# Patient Record
Sex: Female | Born: 1990 | Race: White | Hispanic: No | Smoking: Current every day smoker
Health system: Southern US, Community
[De-identification: ages and names within clinical notes are randomized; demographics above are authoritative.]

## PROBLEM LIST (undated history)

## (undated) DIAGNOSIS — S069X9A Unspecified intracranial injury with loss of consciousness of unspecified duration, initial encounter: Secondary | ICD-10-CM

## (undated) DIAGNOSIS — S069XAA Unspecified intracranial injury with loss of consciousness status unknown, initial encounter: Secondary | ICD-10-CM

## (undated) HISTORY — PX: SPLENECTOMY: SUR1306

## (undated) HISTORY — PX: BRAIN SURGERY: SHX531

---
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
## 2008-01-08 ENCOUNTER — Encounter: Admission: RE | Admit: 2008-01-08 | Discharge: 2008-01-08 | Payer: Self-pay | Admitting: Internal Medicine

## 2008-01-30 ENCOUNTER — Emergency Department (HOSPITAL_COMMUNITY): Admission: EM | Admit: 2008-01-30 | Discharge: 2008-01-30 | Payer: Self-pay | Admitting: Emergency Medicine

## 2008-01-31 ENCOUNTER — Ambulatory Visit: Payer: Self-pay | Admitting: Internal Medicine

## 2008-05-20 ENCOUNTER — Ambulatory Visit: Payer: Self-pay | Admitting: Internal Medicine

## 2008-12-29 ENCOUNTER — Ambulatory Visit: Payer: Self-pay | Admitting: Internal Medicine

## 2009-03-03 IMAGING — CR DG SINUSES COMPLETE 3+V
3 series · 3 of 3 positions shown · non-contrast
Comparison: No priors

CLINICAL DATA: Sinus pain.

PARANASAL SINUSES - 1-2 VIEW

[view not recorded (1 of 3)]
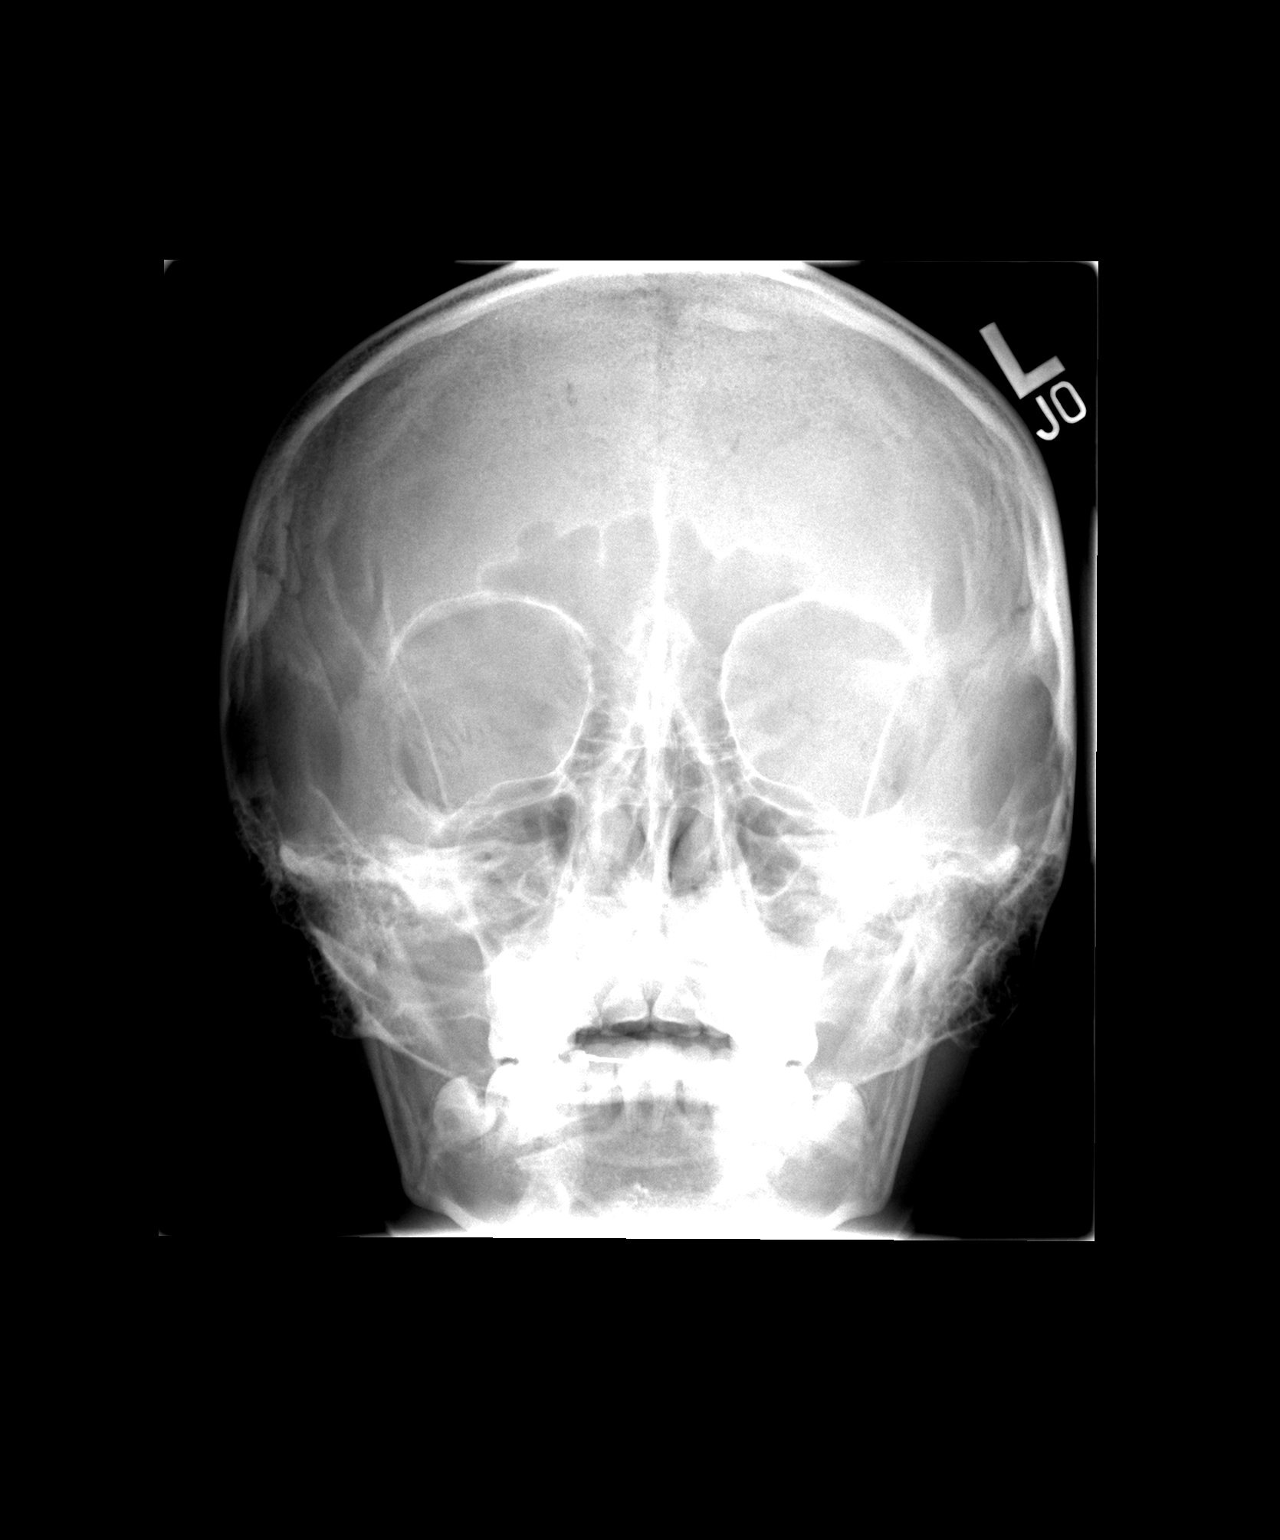

[view not recorded (2 of 3)]
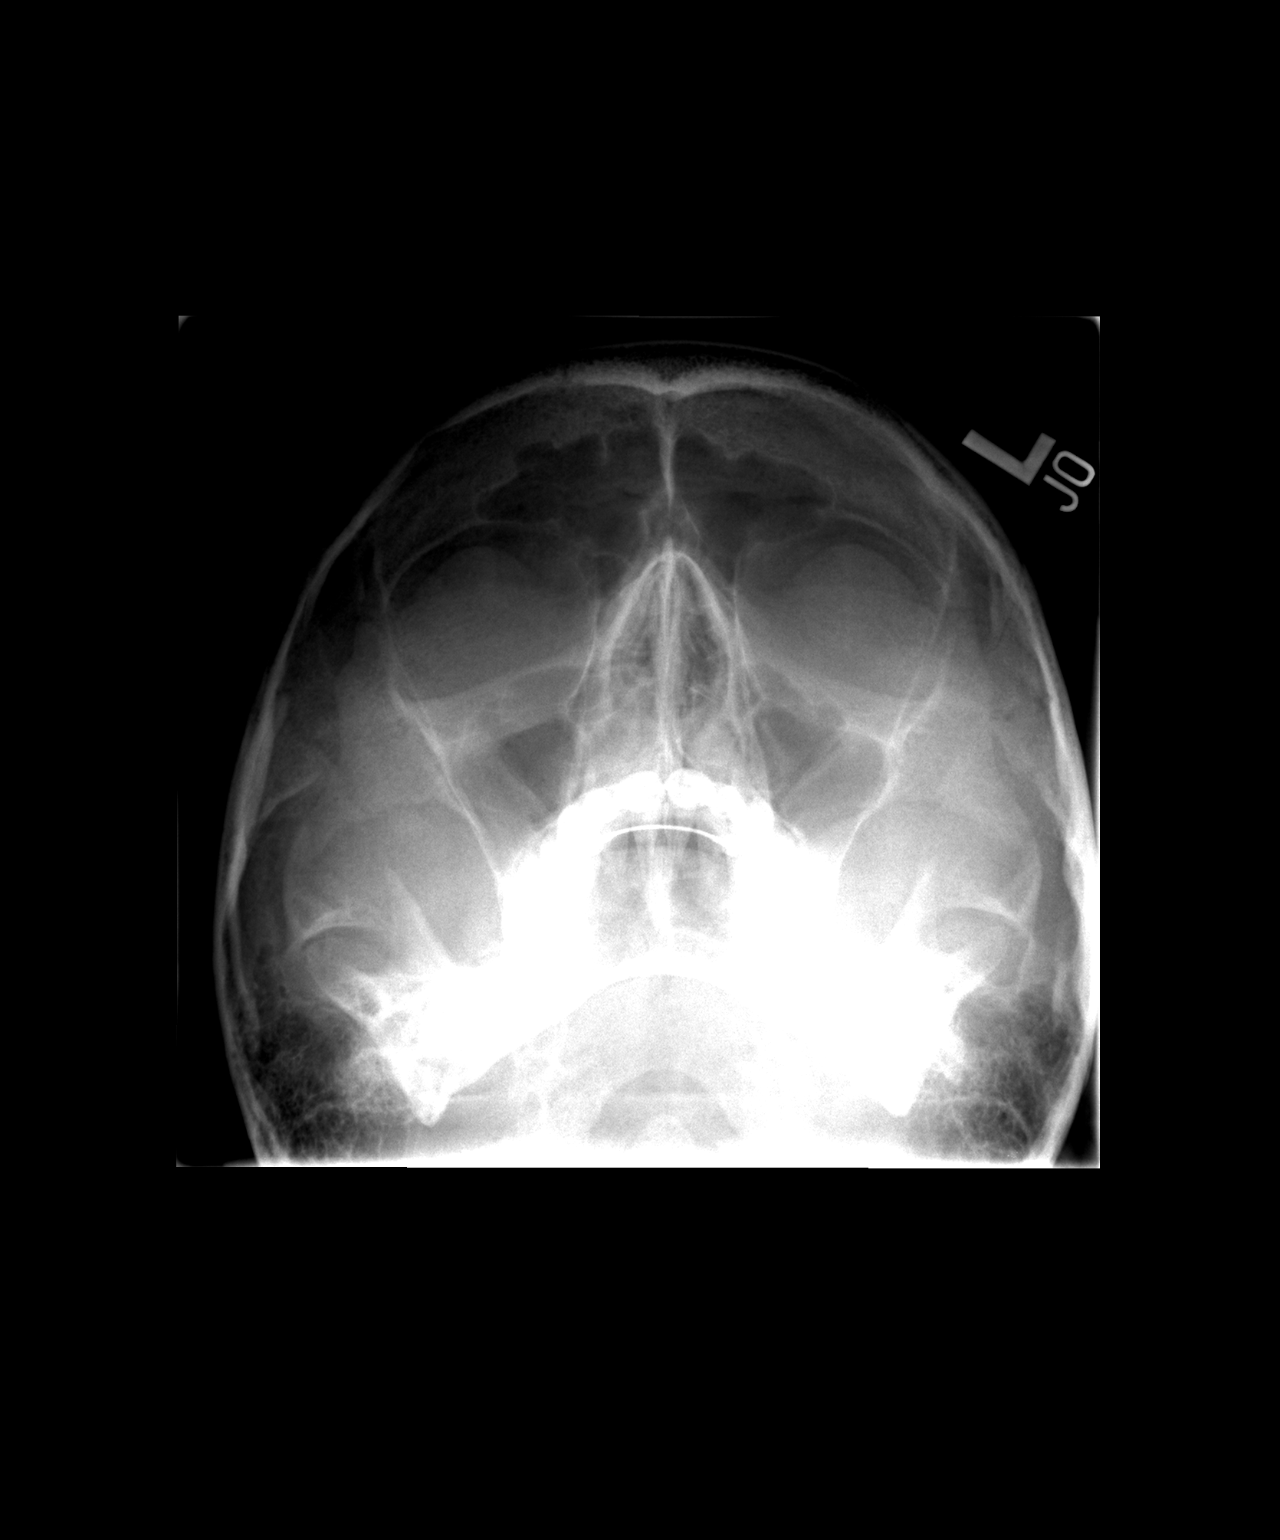

[view not recorded (3 of 3)]
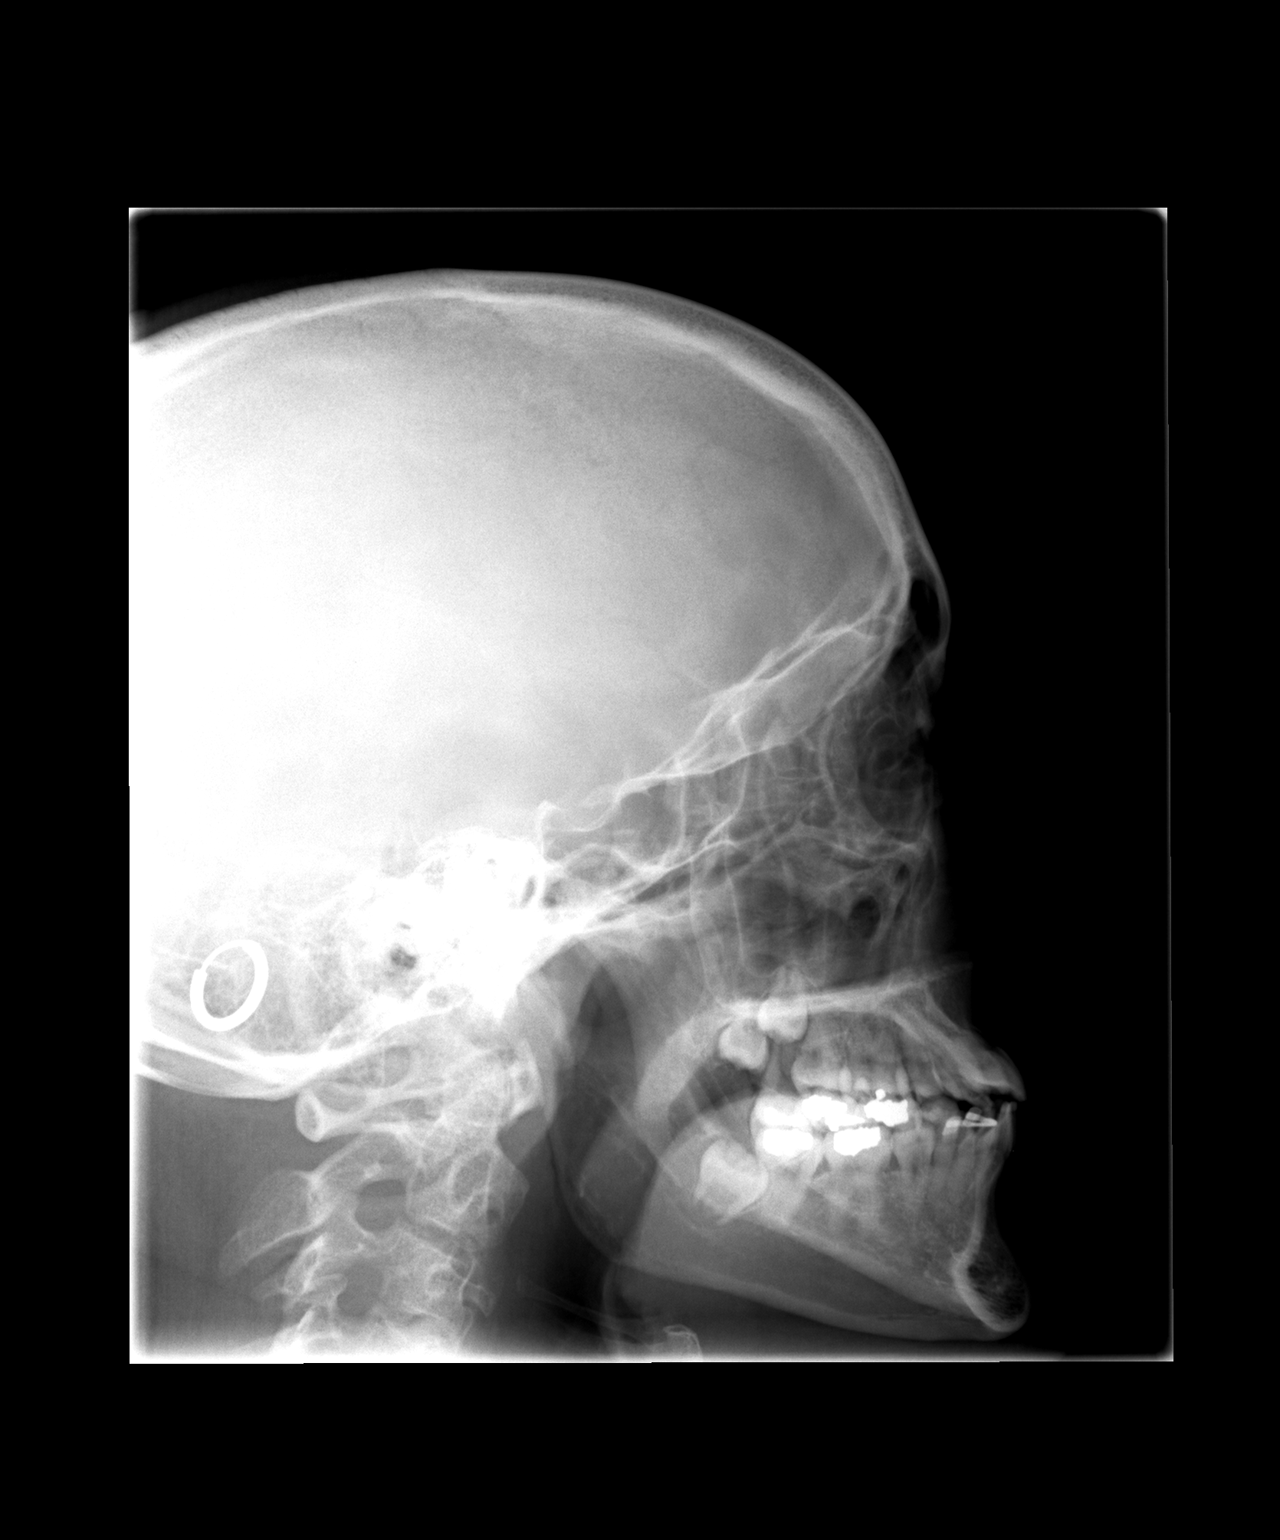

[3 of 3 positions shown; findings below may reference images not displayed]

FINDINGS: There is mucosal thickening of the right maxillary sinus.
No definite air-fluid levels are identified.  Other sinuses
unremarkable by plain film analysis.
IMPRESSION: Chronic right maxillary sinusitis.

## 2011-01-11 LAB — POCT I-STAT, CHEM 8
Calcium, Ion: 1.15
Chloride: 107
HCT: 41
Hemoglobin: 13.9
TCO2: 21

## 2011-01-11 LAB — URINALYSIS, ROUTINE W REFLEX MICROSCOPIC
Hgb urine dipstick: NEGATIVE
Nitrite: NEGATIVE
Urobilinogen, UA: 1
pH: 7

## 2011-01-11 LAB — CBC
HCT: 41.5
Hemoglobin: 13.9
MCHC: 33.4
RDW: 14.1
WBC: 8.9

## 2011-01-11 LAB — DIFFERENTIAL
Basophils Absolute: 0.2 — ABNORMAL HIGH
Basophils Relative: 2 — ABNORMAL HIGH
Eosinophils Absolute: 0.1
Lymphs Abs: 3.1
Monocytes Absolute: 0.5
Neutrophils Relative %: 57

## 2011-01-11 LAB — POCT PREGNANCY, URINE: Preg Test, Ur: NEGATIVE

## 2011-01-11 LAB — URINE MICROSCOPIC-ADD ON

## 2015-05-11 ENCOUNTER — Ambulatory Visit (INDEPENDENT_AMBULATORY_CARE_PROVIDER_SITE_OTHER): Payer: Self-pay | Admitting: Internal Medicine

## 2015-05-11 DIAGNOSIS — Z1329 Encounter for screening for other suspected endocrine disorder: Secondary | ICD-10-CM

## 2015-05-11 DIAGNOSIS — Z0000 Encounter for general adult medical examination without abnormal findings: Secondary | ICD-10-CM

## 2015-05-11 DIAGNOSIS — Z130 Encounter for screening for diseases of the blood and blood-forming organs and certain disorders involving the immune mechanism: Secondary | ICD-10-CM

## 2015-05-11 DIAGNOSIS — Z13220 Encounter for screening for lipoid disorders: Secondary | ICD-10-CM

## 2015-05-12 ENCOUNTER — Telehealth: Payer: Self-pay | Admitting: Internal Medicine

## 2015-05-12 NOTE — Telephone Encounter (Signed)
I think we still have yet to receive records from Dr. Chales Abrahams and [facility]. She will likely need colonoscopy and GI consult. Needs lab work but need to review what she has had at [facility]. May also need Urology consult.

## 2015-05-12 NOTE — Telephone Encounter (Signed)
Patient's Father called; states that her insurance is now delayed until March 1.  Patient has told her Dad that she wants to wait until her insurance is in effect to proceed with any testing and visits UNLESS you find anything in the documentation that you receive from the clinical information from [facility], GI doc, [facility], [facility], etc. that would warrant otherwise.  Otherwise, we should just make her an appointment for this CPE Labs and CPE for some time in March once she has insurance coverage.    Please advise if you're ok with waiting per your review of her clinical information?    Todd advised to call HIM back instead of the patient.    Thanks.

## 2015-05-14 ENCOUNTER — Encounter: Payer: Self-pay | Admitting: Internal Medicine

## 2015-06-04 NOTE — Progress Notes (Signed)
   Subjective:    Patient ID: Ashley Benjamin, female    DOB: [birth year], 25 y.o.   MRN: [id number]  HPI 25 year old White Female presented here today for physical examination and evaluation of abdominal pain. However she has no insurance coverage at the present time. It would be in her best interest to wait until she had insurance coverage which will be in about 4 weeks according to her father who accompanies her today. We also need to collect her records from the various clinics where she has been seen previously. She used to be a patient here a number of years ago but not recently. Lab work which was initially ordered for her physical exam was subsequently canceled.    Review of Systems     Objective:   Physical Exam   Not examined     Assessment & Plan:  To return when she has insurance coverage and proximally 4 weeks

## 2016-09-16 ENCOUNTER — Telehealth: Payer: Self-pay | Admitting: Internal Medicine
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
## 2020-09-04 ENCOUNTER — Emergency Department: Payer: Medicaid Other

## 2020-09-04 ENCOUNTER — Emergency Department
Admission: EM | Admit: 2020-09-04 | Discharge: 2020-09-04 | Disposition: A | Discharge status: Home / Self Care | Payer: Medicaid Other | Attending: Emergency Medicine | Admitting: Emergency Medicine

## 2020-09-04 ENCOUNTER — Other Ambulatory Visit: Payer: Self-pay

## 2020-09-04 DIAGNOSIS — Z3A27 27 weeks gestation of pregnancy: Secondary | ICD-10-CM | POA: Insufficient documentation

## 2020-09-04 DIAGNOSIS — F17210 Nicotine dependence, cigarettes, uncomplicated: Secondary | ICD-10-CM | POA: Insufficient documentation

## 2020-09-04 DIAGNOSIS — O26852 Spotting complicating pregnancy, second trimester: Secondary | ICD-10-CM | POA: Insufficient documentation

## 2020-09-04 DIAGNOSIS — O26892 Other specified pregnancy related conditions, second trimester: Secondary | ICD-10-CM | POA: Insufficient documentation

## 2020-09-04 DIAGNOSIS — O99332 Smoking (tobacco) complicating pregnancy, second trimester: Secondary | ICD-10-CM | POA: Insufficient documentation

## 2020-09-04 DIAGNOSIS — R1030 Lower abdominal pain, unspecified: Secondary | ICD-10-CM | POA: Insufficient documentation

## 2020-09-04 HISTORY — DX: Unspecified intracranial injury with loss of consciousness of unspecified duration, initial encounter: S06.9X9A

## 2020-09-04 HISTORY — DX: Unspecified intracranial injury with loss of consciousness status unknown, initial encounter: S06.9XAA

## 2020-09-04 LAB — CBC
HCT: 40.3 % (ref 36.0–46.0)
Hemoglobin: 13.2 g/dL (ref 12.0–15.0)
MCH: 29.9 pg (ref 26.0–34.0)
MCHC: 32.8 g/dL (ref 30.0–36.0)
MCV: 91.2 fL (ref 80.0–100.0)
Platelets: 566 10*3/uL — ABNORMAL HIGH (ref 150–400)
RBC: 4.42 MIL/uL (ref 3.87–5.11)
RDW: 14.1 % (ref 11.5–15.5)
WBC: 18.1 10*3/uL — ABNORMAL HIGH (ref 4.0–10.5)
nRBC: 0 % (ref 0.0–0.2)

## 2020-09-04 LAB — SAMPLE TO BLOOD BANK

## 2020-09-04 LAB — URINALYSIS, COMPLETE (UACMP) WITH MICROSCOPIC
Bilirubin Urine: NEGATIVE
Glucose, UA: NEGATIVE mg/dL
Hgb urine dipstick: NEGATIVE
Ketones, ur: NEGATIVE mg/dL
Nitrite: NEGATIVE
Protein, ur: NEGATIVE mg/dL
Specific Gravity, Urine: 1.014 (ref 1.005–1.030)
pH: 6 (ref 5.0–8.0)

## 2020-09-04 LAB — URINE DRUG SCREEN, QUALITATIVE (ARMC ONLY)
Amphetamines, Ur Screen: NOT DETECTED
Barbiturates, Ur Screen: NOT DETECTED
Benzodiazepine, Ur Scrn: NOT DETECTED
Cannabinoid 50 Ng, Ur ~~LOC~~: POSITIVE — AB
Cocaine Metabolite,Ur ~~LOC~~: NOT DETECTED
MDMA (Ecstasy)Ur Screen: NOT DETECTED
Methadone Scn, Ur: NOT DETECTED
Opiate, Ur Screen: NOT DETECTED
Phencyclidine (PCP) Ur S: NOT DETECTED
Tricyclic, Ur Screen: NOT DETECTED

## 2020-09-04 LAB — CHLAMYDIA/NGC RT PCR (ARMC ONLY)
Chlamydia Tr: NOT DETECTED
N gonorrhoeae: NOT DETECTED

## 2020-09-04 LAB — BASIC METABOLIC PANEL
Anion gap: 9 (ref 5–15)
BUN: <5 mg/dL — ABNORMAL LOW (ref 6–20)
CO2: 26 mmol/L (ref 22–32)
Calcium: 8.4 mg/dL — ABNORMAL LOW (ref 8.9–10.3)
Chloride: 101 mmol/L (ref 98–111)
Creatinine, Ser: 0.53 mg/dL (ref 0.44–1.00)
GFR, Estimated: >60 mL/min (ref >60)
Glucose, Bld: 77 mg/dL (ref 70–99)
Potassium: 3.7 mmol/L (ref 3.5–5.1)
Sodium: 136 mmol/L (ref 135–145)

## 2020-09-04 LAB — WET PREP, GENITAL
Clue Cells Wet Prep HPF POC: NONE SEEN
Sperm: NONE SEEN
Trich, Wet Prep: NONE SEEN
Yeast Wet Prep HPF POC: NONE SEEN

## 2020-09-04 LAB — ABO/RH: ABO/RH(D): O NEG

## 2020-09-04 LAB — CBG MONITORING, ED: Glucose-Capillary: 92 mg/dL (ref 70–99)

## 2020-09-04 LAB — HCG, QUANTITATIVE, PREGNANCY: hCG, Beta Chain, Quant, S: 12605 m[IU]/mL — ABNORMAL HIGH (ref <5)
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
## 2020-09-04 NOTE — ED Triage Notes (Signed)
Pt c/o abd pain/cramping, states she is about 46months pregnant , has not had prenatal care, skant amount of bleeding. For the past 3-4 days

## 2020-09-04 NOTE — ED Notes (Signed)
Pt c/o nausea, snacks provided by ED tech per her request.

## 2020-09-04 NOTE — Discharge Instructions (Addendum)
Please follow-up with OB as scheduled.  To department for any vaginal bleeding, fluid leakage, abdominal pain, or any other symptom personally concerning to yourself.

## 2022-05-16 DIAGNOSIS — Z5321 Procedure and treatment not carried out due to patient leaving prior to being seen by health care provider: Secondary | ICD-10-CM | POA: Diagnosis not present

## 2022-05-16 DIAGNOSIS — R1030 Lower abdominal pain, unspecified: Secondary | ICD-10-CM | POA: Diagnosis not present

## 2022-09-06 DIAGNOSIS — F332 Major depressive disorder, recurrent severe without psychotic features: Secondary | ICD-10-CM | POA: Diagnosis not present

## 2022-09-22 DIAGNOSIS — F4010 Social phobia, unspecified: Secondary | ICD-10-CM | POA: Diagnosis not present

## 2022-09-22 DIAGNOSIS — Z91014 Allergy to mammalian meats: Secondary | ICD-10-CM | POA: Diagnosis not present

## 2022-09-22 DIAGNOSIS — Z1152 Encounter for screening for COVID-19: Secondary | ICD-10-CM | POA: Diagnosis not present

## 2022-09-22 DIAGNOSIS — F1510 Other stimulant abuse, uncomplicated: Secondary | ICD-10-CM | POA: Diagnosis not present

## 2022-09-22 DIAGNOSIS — R45851 Suicidal ideations: Secondary | ICD-10-CM | POA: Diagnosis not present

## 2022-09-22 DIAGNOSIS — Z87820 Personal history of traumatic brain injury: Secondary | ICD-10-CM | POA: Diagnosis not present

## 2022-09-22 DIAGNOSIS — F339 Major depressive disorder, recurrent, unspecified: Secondary | ICD-10-CM | POA: Diagnosis not present

## 2022-09-22 DIAGNOSIS — F17200 Nicotine dependence, unspecified, uncomplicated: Secondary | ICD-10-CM | POA: Diagnosis not present

## 2022-09-23 DIAGNOSIS — R1112 Projectile vomiting: Secondary | ICD-10-CM | POA: Diagnosis not present

## 2022-09-23 DIAGNOSIS — K589 Irritable bowel syndrome without diarrhea: Secondary | ICD-10-CM | POA: Diagnosis not present

## 2022-09-23 DIAGNOSIS — F419 Anxiety disorder, unspecified: Secondary | ICD-10-CM | POA: Diagnosis not present

## 2022-09-23 DIAGNOSIS — R509 Fever, unspecified: Secondary | ICD-10-CM | POA: Diagnosis not present

## 2022-09-23 DIAGNOSIS — R112 Nausea with vomiting, unspecified: Secondary | ICD-10-CM | POA: Diagnosis not present

## 2022-09-23 DIAGNOSIS — D649 Anemia, unspecified: Secondary | ICD-10-CM | POA: Diagnosis not present

## 2022-09-23 DIAGNOSIS — E860 Dehydration: Secondary | ICD-10-CM | POA: Diagnosis not present

## 2022-09-23 DIAGNOSIS — F32A Depression, unspecified: Secondary | ICD-10-CM | POA: Diagnosis not present

## 2022-09-28 DIAGNOSIS — G4700 Insomnia, unspecified: Secondary | ICD-10-CM | POA: Diagnosis not present

## 2022-09-28 DIAGNOSIS — S20219A Contusion of unspecified front wall of thorax, initial encounter: Secondary | ICD-10-CM | POA: Diagnosis not present

## 2022-09-28 DIAGNOSIS — M25522 Pain in left elbow: Secondary | ICD-10-CM | POA: Diagnosis not present

## 2022-09-28 DIAGNOSIS — R410 Disorientation, unspecified: Secondary | ICD-10-CM | POA: Diagnosis not present

## 2022-09-28 DIAGNOSIS — G9389 Other specified disorders of brain: Secondary | ICD-10-CM | POA: Diagnosis not present

## 2022-09-28 DIAGNOSIS — F1290 Cannabis use, unspecified, uncomplicated: Secondary | ICD-10-CM | POA: Diagnosis not present

## 2022-09-28 DIAGNOSIS — Z87820 Personal history of traumatic brain injury: Secondary | ICD-10-CM | POA: Diagnosis not present

## 2022-09-28 DIAGNOSIS — S299XXA Unspecified injury of thorax, initial encounter: Secondary | ICD-10-CM | POA: Diagnosis not present

## 2022-09-28 DIAGNOSIS — S90811A Abrasion, right foot, initial encounter: Secondary | ICD-10-CM | POA: Diagnosis not present

## 2022-09-28 DIAGNOSIS — R0902 Hypoxemia: Secondary | ICD-10-CM | POA: Diagnosis not present

## 2022-09-28 DIAGNOSIS — Z23 Encounter for immunization: Secondary | ICD-10-CM | POA: Diagnosis not present

## 2022-09-28 DIAGNOSIS — R569 Unspecified convulsions: Secondary | ICD-10-CM | POA: Diagnosis not present

## 2022-09-28 DIAGNOSIS — S40212A Abrasion of left shoulder, initial encounter: Secondary | ICD-10-CM | POA: Diagnosis not present

## 2022-09-28 DIAGNOSIS — G40909 Epilepsy, unspecified, not intractable, without status epilepticus: Secondary | ICD-10-CM | POA: Diagnosis not present

## 2022-09-28 DIAGNOSIS — M79671 Pain in right foot: Secondary | ICD-10-CM | POA: Diagnosis not present

## 2022-09-28 DIAGNOSIS — E872 Acidosis, unspecified: Secondary | ICD-10-CM | POA: Diagnosis not present

## 2022-09-28 DIAGNOSIS — R079 Chest pain, unspecified: Secondary | ICD-10-CM | POA: Diagnosis not present

## 2022-09-28 DIAGNOSIS — S59902A Unspecified injury of left elbow, initial encounter: Secondary | ICD-10-CM | POA: Diagnosis not present

## 2022-09-28 DIAGNOSIS — S80212A Abrasion, left knee, initial encounter: Secondary | ICD-10-CM | POA: Diagnosis not present

## 2022-09-28 DIAGNOSIS — S0990XA Unspecified injury of head, initial encounter: Secondary | ICD-10-CM | POA: Diagnosis not present

## 2022-09-28 DIAGNOSIS — Y92524 Gas station as the place of occurrence of the external cause: Secondary | ICD-10-CM | POA: Diagnosis not present

## 2022-09-28 DIAGNOSIS — S99921A Unspecified injury of right foot, initial encounter: Secondary | ICD-10-CM | POA: Diagnosis not present

## 2022-09-28 DIAGNOSIS — Z9889 Other specified postprocedural states: Secondary | ICD-10-CM | POA: Diagnosis not present

## 2022-09-28 DIAGNOSIS — S20211A Contusion of right front wall of thorax, initial encounter: Secondary | ICD-10-CM | POA: Diagnosis not present

## 2022-09-28 DIAGNOSIS — W19XXXA Unspecified fall, initial encounter: Secondary | ICD-10-CM | POA: Diagnosis not present

## 2022-09-28 DIAGNOSIS — Z79899 Other long term (current) drug therapy: Secondary | ICD-10-CM | POA: Diagnosis not present

## 2022-09-29 DIAGNOSIS — R42 Dizziness and giddiness: Secondary | ICD-10-CM | POA: Diagnosis not present

## 2022-09-29 DIAGNOSIS — E876 Hypokalemia: Secondary | ICD-10-CM | POA: Diagnosis not present

## 2022-09-29 DIAGNOSIS — S0083XA Contusion of other part of head, initial encounter: Secondary | ICD-10-CM | POA: Diagnosis not present

## 2022-09-29 DIAGNOSIS — N3001 Acute cystitis with hematuria: Secondary | ICD-10-CM | POA: Diagnosis not present

## 2022-09-29 DIAGNOSIS — F1490 Cocaine use, unspecified, uncomplicated: Secondary | ICD-10-CM | POA: Diagnosis not present

## 2022-09-29 DIAGNOSIS — F17210 Nicotine dependence, cigarettes, uncomplicated: Secondary | ICD-10-CM | POA: Diagnosis not present

## 2022-09-29 DIAGNOSIS — G40909 Epilepsy, unspecified, not intractable, without status epilepticus: Secondary | ICD-10-CM | POA: Diagnosis not present

## 2022-09-29 DIAGNOSIS — R569 Unspecified convulsions: Secondary | ICD-10-CM | POA: Diagnosis not present

## 2022-09-29 DIAGNOSIS — F1590 Other stimulant use, unspecified, uncomplicated: Secondary | ICD-10-CM | POA: Diagnosis not present

## 2022-09-29 DIAGNOSIS — G4089 Other seizures: Secondary | ICD-10-CM | POA: Diagnosis not present

## 2022-09-29 DIAGNOSIS — D72829 Elevated white blood cell count, unspecified: Secondary | ICD-10-CM | POA: Diagnosis not present

## 2022-09-30 DIAGNOSIS — R42 Dizziness and giddiness: Secondary | ICD-10-CM | POA: Diagnosis not present

## 2022-09-30 DIAGNOSIS — R569 Unspecified convulsions: Secondary | ICD-10-CM | POA: Diagnosis not present

## 2022-09-30 DIAGNOSIS — Z5321 Procedure and treatment not carried out due to patient leaving prior to being seen by health care provider: Secondary | ICD-10-CM | POA: Diagnosis not present

## 2022-09-30 DIAGNOSIS — F17210 Nicotine dependence, cigarettes, uncomplicated: Secondary | ICD-10-CM | POA: Diagnosis not present

## 2022-10-01 DIAGNOSIS — K588 Other irritable bowel syndrome: Secondary | ICD-10-CM | POA: Diagnosis not present

## 2022-10-01 DIAGNOSIS — F17210 Nicotine dependence, cigarettes, uncomplicated: Secondary | ICD-10-CM | POA: Diagnosis not present

## 2022-10-01 DIAGNOSIS — F419 Anxiety disorder, unspecified: Secondary | ICD-10-CM | POA: Diagnosis not present

## 2022-10-01 DIAGNOSIS — F99 Mental disorder, not otherwise specified: Secondary | ICD-10-CM | POA: Diagnosis not present

## 2022-10-01 DIAGNOSIS — F32A Depression, unspecified: Secondary | ICD-10-CM | POA: Diagnosis not present

## 2022-10-01 DIAGNOSIS — F1910 Other psychoactive substance abuse, uncomplicated: Secondary | ICD-10-CM | POA: Diagnosis not present

## 2022-10-01 DIAGNOSIS — G4089 Other seizures: Secondary | ICD-10-CM | POA: Diagnosis not present

## 2022-10-01 DIAGNOSIS — F1510 Other stimulant abuse, uncomplicated: Secondary | ICD-10-CM | POA: Diagnosis not present

## 2022-10-01 DIAGNOSIS — Z0489 Encounter for examination and observation for other specified reasons: Secondary | ICD-10-CM | POA: Diagnosis not present

## 2022-10-01 DIAGNOSIS — R45851 Suicidal ideations: Secondary | ICD-10-CM | POA: Diagnosis not present

## 2022-10-01 DIAGNOSIS — H04129 Dry eye syndrome of unspecified lacrimal gland: Secondary | ICD-10-CM | POA: Diagnosis not present

## 2022-10-01 DIAGNOSIS — Z79899 Other long term (current) drug therapy: Secondary | ICD-10-CM | POA: Diagnosis not present

## 2022-10-01 DIAGNOSIS — G43909 Migraine, unspecified, not intractable, without status migrainosus: Secondary | ICD-10-CM | POA: Diagnosis not present

## 2022-10-01 DIAGNOSIS — F1110 Opioid abuse, uncomplicated: Secondary | ICD-10-CM | POA: Diagnosis not present

## 2022-10-01 DIAGNOSIS — R238 Other skin changes: Secondary | ICD-10-CM | POA: Diagnosis not present

## 2022-10-01 DIAGNOSIS — N9489 Other specified conditions associated with female genital organs and menstrual cycle: Secondary | ICD-10-CM | POA: Diagnosis not present

## 2022-10-01 DIAGNOSIS — N390 Urinary tract infection, site not specified: Secondary | ICD-10-CM | POA: Diagnosis not present

## 2022-10-01 DIAGNOSIS — R42 Dizziness and giddiness: Secondary | ICD-10-CM | POA: Diagnosis not present

## 2022-10-01 DIAGNOSIS — E559 Vitamin D deficiency, unspecified: Secondary | ICD-10-CM | POA: Diagnosis not present

## 2022-10-01 DIAGNOSIS — G4700 Insomnia, unspecified: Secondary | ICD-10-CM | POA: Diagnosis not present

## 2022-10-01 DIAGNOSIS — S51812A Laceration without foreign body of left forearm, initial encounter: Secondary | ICD-10-CM | POA: Diagnosis not present

## 2022-10-01 DIAGNOSIS — E876 Hypokalemia: Secondary | ICD-10-CM | POA: Diagnosis not present

## 2022-10-01 DIAGNOSIS — Z1152 Encounter for screening for COVID-19: Secondary | ICD-10-CM | POA: Diagnosis not present

## 2022-10-01 DIAGNOSIS — X789XXA Intentional self-harm by unspecified sharp object, initial encounter: Secondary | ICD-10-CM | POA: Diagnosis not present

## 2022-10-01 DIAGNOSIS — F1210 Cannabis abuse, uncomplicated: Secondary | ICD-10-CM | POA: Diagnosis not present

## 2022-10-01 DIAGNOSIS — F39 Unspecified mood [affective] disorder: Secondary | ICD-10-CM | POA: Diagnosis not present

## 2022-10-01 DIAGNOSIS — F1490 Cocaine use, unspecified, uncomplicated: Secondary | ICD-10-CM | POA: Diagnosis not present

## 2022-10-01 DIAGNOSIS — I951 Orthostatic hypotension: Secondary | ICD-10-CM | POA: Diagnosis not present

## 2022-10-01 DIAGNOSIS — Z20828 Contact with and (suspected) exposure to other viral communicable diseases: Secondary | ICD-10-CM | POA: Diagnosis not present

## 2022-10-06 DIAGNOSIS — F39 Unspecified mood [affective] disorder: Secondary | ICD-10-CM | POA: Diagnosis not present

## 2023-01-15 DIAGNOSIS — R55 Syncope and collapse: Secondary | ICD-10-CM | POA: Diagnosis not present

## 2023-01-26 DIAGNOSIS — R55 Syncope and collapse: Secondary | ICD-10-CM | POA: Diagnosis not present

## 2023-03-02 DIAGNOSIS — S299XXA Unspecified injury of thorax, initial encounter: Secondary | ICD-10-CM | POA: Diagnosis not present

## 2023-03-02 DIAGNOSIS — S0011XA Contusion of right eyelid and periocular area, initial encounter: Secondary | ICD-10-CM | POA: Diagnosis not present

## 2023-03-02 DIAGNOSIS — R739 Hyperglycemia, unspecified: Secondary | ICD-10-CM | POA: Diagnosis not present

## 2023-03-02 DIAGNOSIS — S0990XA Unspecified injury of head, initial encounter: Secondary | ICD-10-CM | POA: Diagnosis not present

## 2023-03-02 DIAGNOSIS — S0511XA Contusion of eyeball and orbital tissues, right eye, initial encounter: Secondary | ICD-10-CM | POA: Diagnosis not present

## 2023-03-02 DIAGNOSIS — M542 Cervicalgia: Secondary | ICD-10-CM | POA: Diagnosis not present

## 2023-03-02 DIAGNOSIS — S3992XA Unspecified injury of lower back, initial encounter: Secondary | ICD-10-CM | POA: Diagnosis not present

## 2023-03-02 DIAGNOSIS — S199XXA Unspecified injury of neck, initial encounter: Secondary | ICD-10-CM | POA: Diagnosis not present

## 2023-03-03 DIAGNOSIS — S0511XA Contusion of eyeball and orbital tissues, right eye, initial encounter: Secondary | ICD-10-CM | POA: Diagnosis not present

## 2023-03-03 DIAGNOSIS — S3992XA Unspecified injury of lower back, initial encounter: Secondary | ICD-10-CM | POA: Diagnosis not present

## 2023-03-03 DIAGNOSIS — S0990XA Unspecified injury of head, initial encounter: Secondary | ICD-10-CM | POA: Diagnosis not present

## 2023-03-03 DIAGNOSIS — S299XXA Unspecified injury of thorax, initial encounter: Secondary | ICD-10-CM | POA: Diagnosis not present

## 2023-03-03 DIAGNOSIS — S199XXA Unspecified injury of neck, initial encounter: Secondary | ICD-10-CM | POA: Diagnosis not present
